# Patient Record
Sex: Male | Born: 1950 | Race: Black or African American | Hispanic: No | Marital: Married | Smoking: Never smoker
Health system: Southern US, Community
[De-identification: ages and names within clinical notes are randomized; demographics above are authoritative.]

## PROBLEM LIST (undated history)

## (undated) DIAGNOSIS — N200 Calculus of kidney: Secondary | ICD-10-CM

---

## 2016-08-17 ENCOUNTER — Encounter (HOSPITAL_COMMUNITY): Payer: Self-pay | Admitting: *Deleted

## 2016-08-17 ENCOUNTER — Emergency Department (HOSPITAL_COMMUNITY)
Admission: EM | Admit: 2016-08-17 | Discharge: 2016-08-17 | Disposition: A | Discharge status: Home / Self Care | Payer: Self-pay | Attending: Emergency Medicine | Admitting: Emergency Medicine

## 2016-08-17 ENCOUNTER — Emergency Department (HOSPITAL_COMMUNITY): Payer: Self-pay

## 2016-08-17 DIAGNOSIS — N1330 Unspecified hydronephrosis: Secondary | ICD-10-CM | POA: Insufficient documentation

## 2016-08-17 DIAGNOSIS — N201 Calculus of ureter: Secondary | ICD-10-CM | POA: Insufficient documentation

## 2016-08-17 DIAGNOSIS — N23 Unspecified renal colic: Secondary | ICD-10-CM

## 2016-08-17 LAB — URINALYSIS, ROUTINE W REFLEX MICROSCOPIC
Bacteria, UA: NONE SEEN
Bilirubin Urine: NEGATIVE
GLUCOSE, UA: NEGATIVE mg/dL
Ketones, ur: NEGATIVE mg/dL
Leukocytes, UA: NEGATIVE
NITRITE: NEGATIVE
Protein, ur: NEGATIVE mg/dL
SPECIFIC GRAVITY, URINE: 1.009 (ref 1.005–1.030)
Squamous Epithelial / LPF: NONE SEEN
pH: 5 (ref 5.0–8.0)

## 2016-08-17 LAB — BASIC METABOLIC PANEL
Anion gap: 8 (ref 5–15)
BUN: 19 mg/dL (ref 6–20)
CO2: 25 mmol/L (ref 22–32)
CREATININE: 1.22 mg/dL (ref 0.61–1.24)
Calcium: 9.3 mg/dL (ref 8.9–10.3)
Chloride: 104 mmol/L (ref 101–111)
Glucose, Bld: 114 mg/dL — ABNORMAL HIGH (ref 65–99)
POTASSIUM: 4.3 mmol/L (ref 3.5–5.1)
SODIUM: 137 mmol/L (ref 135–145)

## 2016-08-17 LAB — CBC WITH DIFFERENTIAL/PLATELET
BASOS PCT: 0 %
Basophils Absolute: 0 10*3/uL (ref 0.0–0.1)
EOS ABS: 0.1 10*3/uL (ref 0.0–0.7)
EOS PCT: 3 %
HCT: 42.4 % (ref 39.0–52.0)
Hemoglobin: 14.3 g/dL (ref 13.0–17.0)
LYMPHS ABS: 1.6 10*3/uL (ref 0.7–4.0)
Lymphocytes Relative: 40 %
MCH: 29.9 pg (ref 26.0–34.0)
MCHC: 33.7 g/dL (ref 30.0–36.0)
MCV: 88.7 fL (ref 78.0–100.0)
Monocytes Absolute: 0.3 10*3/uL (ref 0.1–1.0)
Monocytes Relative: 7 %
Neutro Abs: 2 10*3/uL (ref 1.7–7.7)
Neutrophils Relative %: 50 %
PLATELETS: 184 10*3/uL (ref 150–400)
RBC: 4.78 MIL/uL (ref 4.22–5.81)
RDW: 13.7 % (ref 11.5–15.5)
WBC: 4.1 10*3/uL (ref 4.0–10.5)

## 2016-08-17 MED ORDER — TAMSULOSIN HCL 0.4 MG PO CAPS: 0.4000 mg | ORAL_CAPSULE | Freq: Every day | ORAL | 0 refills | Status: DC

## 2016-08-17 MED ORDER — IBUPROFEN 600 MG PO TABS: 600.0000 mg | ORAL_TABLET | Freq: Four times a day (QID) | ORAL | 0 refills | Status: DC | PRN

## 2016-08-17 MED ORDER — ONDANSETRON HCL 4 MG/2ML IJ SOLN
4.0000 mg | Freq: Once | INTRAMUSCULAR | Status: DC
  Filled 2016-08-17: qty 2

## 2016-08-17 MED ORDER — OXYCODONE-ACETAMINOPHEN 5-325 MG PO TABS: 1.0000 | ORAL_TABLET | Freq: Four times a day (QID) | ORAL | 0 refills | Status: DC | PRN

## 2016-08-17 MED ORDER — FENTANYL CITRATE (PF) 100 MCG/2ML IJ SOLN
50.0000 ug | INTRAMUSCULAR | Status: DC | PRN
Start: 2016-08-17 — End: 2016-08-17
  Filled 2016-08-17: qty 2

## 2016-08-17 MED ORDER — IBUPROFEN 400 MG PO TABS
600.0000 mg | ORAL_TABLET | Freq: Once | ORAL | Status: AC
  Administered 2016-08-17: 06:00:00 600 mg via ORAL
  Filled 2016-08-17: qty 1

## 2016-08-17 NOTE — ED Notes (Signed)
Patient transported to CT 

## 2016-08-17 NOTE — ED Notes (Signed)
Declined W/C at D/C and was escorted to lobby by RN. 

## 2016-08-17 NOTE — ED Provider Notes (Signed)
[facility]-EMERGENCY DEPT Provider Note   CSN: [id number] Arrival date & time: 08/17/16  0341     History   Chief Complaint Chief Complaint  Patient presents with  . Flank Pain    HPI Lance Wells is a 66 y.o. male.  Patient with no past surgical history presents with complaint of severe groin pain starting yesterday (4/24). Patient states that he stood up and pain moved to his right flank. Pain was severe at times. It was so bad he decided to come to the hospital. Symptoms improved and nearly resolved upon arrival. No associated fevers, nausea, vomiting, diarrhea. No urinary symptoms, hematuria, dysuria. No blood in the stool. No lightheadedness or syncope. No chest pain or shortness of breath. No treatments prior to arrival. Patient was ordered pain medication but declined because he was feeling better. No history of kidney stones or known aortic problems. The onset of this condition was acute. The course is resolved. Aggravating factors: none. Alleviating factors: none.        History reviewed. No pertinent past medical history.  There are no active problems to display for this patient.   History reviewed. No pertinent surgical history.     Home Medications    Prior to Admission medications   Not on File    Family History No family history on file.  Social History Social History  Substance Use Topics  . Smoking status: Never Smoker  . Smokeless tobacco: Current User  . Alcohol use No     Allergies   Patient has no known allergies.   Review of Systems Review of Systems  Constitutional: Negative for fever.  HENT: Negative for rhinorrhea and sore throat.   Eyes: Negative for redness.  Respiratory: Negative for cough.   Cardiovascular: Negative for chest pain.  Gastrointestinal: Negative for abdominal pain, diarrhea, nausea and vomiting.  Genitourinary: Positive for flank pain. Negative for dysuria.  Musculoskeletal: Negative for myalgias.  Skin:  Negative for rash.  Neurological: Negative for headaches.     Physical Exam Updated Vital Signs BP 133/85   Pulse 64   Temp 97.7 F (36.5 C)   Resp 18   SpO2 99%   Physical Exam  Constitutional: He appears well-developed and well-nourished.  HENT:  Head: Normocephalic and atraumatic.  Mouth/Throat: Oropharynx is clear and moist.  Eyes: Conjunctivae are normal. Right eye exhibits no discharge. Left eye exhibits no discharge.  Neck: Normal range of motion. Neck supple.  Cardiovascular: Normal rate, regular rhythm and normal heart sounds.   Pulmonary/Chest: Effort normal and breath sounds normal. No respiratory distress. He has no wheezes. He has no rales.  Abdominal: Soft. He exhibits no mass. There is no tenderness. There is no rebound and no guarding.  Neurological: He is alert.  Skin: Skin is warm and dry.  Psychiatric: He has a normal mood and affect.  Nursing note and vitals reviewed.    ED Treatments / Results  Labs (all labs ordered are listed, but only abnormal results are displayed) Labs Reviewed  BASIC METABOLIC PANEL - Abnormal; Notable for the following:       Result Value   Glucose, Bld 114 (*)    All other components within normal limits  URINALYSIS, ROUTINE W REFLEX MICROSCOPIC - Abnormal; Notable for the following:    Color, Urine STRAW (*)    Hgb urine dipstick MODERATE (*)    All other components within normal limits  CBC WITH DIFFERENTIAL/PLATELET    EKG  EKG Interpretation None  Radiology Ct Renal Stone Study  Result Date: 08/17/2016 CLINICAL DATA:  66 year old male with right flank pain radiating to the groin. EXAM: CT ABDOMEN AND PELVIS WITHOUT CONTRAST TECHNIQUE: Multidetector CT imaging of the abdomen and pelvis was performed following the standard protocol without IV contrast. COMPARISON:  None. FINDINGS: Lower chest: There is a 3 mm left lung base subpleural nodule. The visualized lung bases are otherwise clear. No intra-abdominal  free air or free fluid. Hepatobiliary: No focal liver abnormality is seen. No gallstones, gallbladder wall thickening, or biliary dilatation. Pancreas: Unremarkable. No pancreatic ductal dilatation or surrounding inflammatory changes. Spleen: Normal in size without focal abnormality. Adrenals/Urinary Tract: There is a 4 mm partially obstructing right ureteropelvic junction stone with mild right hydronephrosis. The left kidney, adrenal glands, and urinary bladder appear unremarkable. Stomach/Bowel: Moderate stool throughout the colon. No evidence of bowel obstruction or active inflammation. Normal appendix. Vascular/Lymphatic: The abdominal aorta and IVC are grossly unremarkable on this noncontrast study. No portal venous gas identified. There is no adenopathy. Reproductive: The prostate and seminal vesicles are grossly unremarkable. Other: None Musculoskeletal: No acute or significant osseous findings. IMPRESSION: A 4 mm right UPJ stone with mild right hydronephrosis. No bowel obstruction or active inflammation.  Normal appendix. Electronically Signed   By: Elgie Collard M.D.   On: 08/17/2016 05:00    Procedures Procedures (including critical care time)  Medications Ordered in ED Medications  ibuprofen (ADVIL,MOTRIN) tablet 600 mg (not administered)     Initial Impression / Assessment and Plan / ED Course  I have reviewed the triage vital signs and the nursing notes.  Pertinent labs & imaging results that were available during my care of the patient were reviewed by me and considered in my medical decision making (see chart for details).     Patient seen and examined. Work-up initiated.   Vital signs reviewed and are as follows: BP 133/85   Pulse 64   Temp 97.7 F (36.5 C)   Resp 18   SpO2 99%   Patient updated on results. Pain continues to be well-controlled. Patient counseled on kidney stone treatment. Urged patient to strain urine and save any stones. Urged urology follow-up and  return to [facility] with any complications. Counseled patient to maintain good fluid intake.   Counseled patient on use of Flomax.   Patient counseled on use of narcotic pain medications. Counseled not to combine these medications with others containing tylenol. Urged not to drink alcohol, drive, or perform any other activities that requires focus while taking these medications. The patient verbalizes understanding and agrees with the plan.   Final Clinical Impressions(s) / ED Diagnoses   Final diagnoses:  Ureteral colic   Patient with flank pain, symptoms resolved upon arrival. Pain has remained well controlled despite a 4 mm UPJ stone on the right side. This is consistent with the patient's description of pain prior to arrival. No other concerning findings. No urine infection. Normal creatinine. Patient will be discharged home with symptomatic treatment and urology follow-up. Return instructions as above.  New Prescriptions New Prescriptions   IBUPROFEN (ADVIL,MOTRIN) 600 MG TABLET    Take 1 tablet (600 mg total) by mouth every 6 (six) hours as needed.   OXYCODONE-ACETAMINOPHEN (PERCOCET/ROXICET) 5-325 MG TABLET    Take 1-2 tablets by mouth every 6 (six) hours as needed for severe pain.   TAMSULOSIN (FLOMAX) 0.4 MG CAPS CAPSULE    Take 1 capsule (0.4 mg total) by mouth daily.     Renne Crigler, PA-C  08/17/16 1308    Geoffery Lyons, MD 08/18/16 [phone]

## 2016-08-17 NOTE — ED Notes (Signed)
Pt woke this morning with testicular pain; pt now c/o R flank pain. Pt appears uncomfortable.

## 2016-08-17 NOTE — Discharge Instructions (Signed)
Please read and follow all provided instructions.  Your diagnoses today include:  1. Ureteral colic    Tests performed today include:  Urine test that showed blood in your urine and no infection  CT scan which showed a 4 millimeter kidney on the right side  Blood test that showed normal kidney function  Vital signs. See below for your results today.   Medications prescribed:   Percocet (oxycodone/acetaminophen) - narcotic pain medication  DO NOT drive or perform any activities that require you to be awake and alert because this medicine can make you drowsy. BE VERY CAREFUL not to take multiple medicines containing Tylenol (also called acetaminophen). Doing so can lead to an overdose which can damage your liver and cause liver failure and possibly death.   Ibuprofen (Motrin, Advil) - anti-inflammatory pain medication  Do not exceed  ibuprofen every 6 hours, take with food  You have been prescribed an anti-inflammatory medication or NSAID. Take with food. Take smallest effective dose for the shortest duration needed for your pain. Stop taking if you experience stomach pain or vomiting.    Flomax (tamsulosin) - relaxes smooth muscle to help kidney stones pass  Take any prescribed medications only as directed.  Home care instructions:  Follow any educational materials contained in this packet.  Please double your fluid intake for the next several days. Strain your urine and save any stones that may pass.   BE VERY CAREFUL not to take multiple medicines containing Tylenol (also called acetaminophen). Doing so can lead to an overdose which can damage your liver and cause liver failure and possibly death.   Follow-up instructions: Please follow-up with your urologist or the urologist referral (provided on front page) in the next 1 week for further evaluation of your symptoms.  If you need to return to the Emergency Department, go to [facility] and not [facility]. The urologists are located at [facility] and can better care for you at this location.  Return instructions:  If you need to return to the Emergency Department, go to [facility] and not [facility]. The urologists are located at [facility] and can better care for you at this location.   Please return to the Emergency Department if you experience worsening symptoms.  Please return if you develop fever or uncontrolled pain or vomiting.  Please return if you have any other emergent concerns.  Additional Information:  Your vital signs today were: BP 133/85    Pulse 64    Temp 97.7 F (36.5 C)    Resp 18    SpO2 99%  If your blood pressure (BP) was elevated above 135/85 this visit, please have this repeated by your doctor within one month. --------------

## 2016-08-17 NOTE — ED Notes (Signed)
Pt denies pain as present. Declined wanting pain medications

## 2016-08-28 DIAGNOSIS — N200 Calculus of kidney: Secondary | ICD-10-CM | POA: Insufficient documentation

## 2016-08-28 DIAGNOSIS — Z79899 Other long term (current) drug therapy: Secondary | ICD-10-CM | POA: Insufficient documentation

## 2016-08-28 NOTE — ED Notes (Signed)
Took oxycodone before EMS arrival.

## 2016-08-28 NOTE — ED Triage Notes (Signed)
Pt comes from air port after flying home from [place]. 10 days ago he was diagnosed with a 4 mm kidney stone on the right side and passed it 8 days ago and pain ceased.  Tonight he began to have the same right sided pain and pressure.  States it feels the same as the kidney stone.  Vitals WNL. Ambulatory.

## 2016-08-29 ENCOUNTER — Emergency Department (HOSPITAL_COMMUNITY)
Admission: EM | Admit: 2016-08-29 | Discharge: 2016-08-29 | Disposition: A | Discharge status: Home / Self Care | Payer: Self-pay | Attending: Emergency Medicine | Admitting: Emergency Medicine

## 2016-08-29 ENCOUNTER — Encounter (HOSPITAL_COMMUNITY): Payer: Self-pay | Admitting: Emergency Medicine

## 2016-08-29 DIAGNOSIS — N200 Calculus of kidney: Secondary | ICD-10-CM

## 2016-08-29 HISTORY — DX: Calculus of kidney: N20.0

## 2016-08-29 LAB — CBC WITH DIFFERENTIAL/PLATELET
BASOS PCT: 0 %
Basophils Absolute: 0 10*3/uL (ref 0.0–0.1)
Eosinophils Absolute: 0.2 10*3/uL (ref 0.0–0.7)
Eosinophils Relative: 3 %
HEMATOCRIT: 44 % (ref 39.0–52.0)
HEMOGLOBIN: 15.7 g/dL (ref 13.0–17.0)
LYMPHS ABS: 1.9 10*3/uL (ref 0.7–4.0)
Lymphocytes Relative: 32 %
MCH: 31.2 pg (ref 26.0–34.0)
MCHC: 35.7 g/dL (ref 30.0–36.0)
MCV: 87.3 fL (ref 78.0–100.0)
MONOS PCT: 10 %
Monocytes Absolute: 0.6 10*3/uL (ref 0.1–1.0)
NEUTROS PCT: 55 %
Neutro Abs: 3.3 10*3/uL (ref 1.7–7.7)
Platelets: 198 10*3/uL (ref 150–400)
RBC: 5.04 MIL/uL (ref 4.22–5.81)
RDW: 13.8 % (ref 11.5–15.5)
WBC: 5.9 10*3/uL (ref 4.0–10.5)

## 2016-08-29 LAB — URINALYSIS, ROUTINE W REFLEX MICROSCOPIC
Bacteria, UA: NONE SEEN
Bilirubin Urine: NEGATIVE
GLUCOSE, UA: NEGATIVE mg/dL
KETONES UR: NEGATIVE mg/dL
Leukocytes, UA: NEGATIVE
Nitrite: NEGATIVE
PH: 5 (ref 5.0–8.0)
Protein, ur: NEGATIVE mg/dL
SPECIFIC GRAVITY, URINE: 1.03 (ref 1.005–1.030)

## 2016-08-29 LAB — I-STAT CHEM 8, ED
BUN: 23 mg/dL — AB (ref 6–20)
CALCIUM ION: 1.11 mmol/L — AB (ref 1.15–1.40)
CHLORIDE: 106 mmol/L (ref 101–111)
CREATININE: 1 mg/dL (ref 0.61–1.24)
GLUCOSE: 123 mg/dL — AB (ref 65–99)
HCT: 44 % (ref 39.0–52.0)
Hemoglobin: 15 g/dL (ref 13.0–17.0)
Potassium: 4.5 mmol/L (ref 3.5–5.1)
Sodium: 138 mmol/L (ref 135–145)
TCO2: 25 mmol/L (ref 0–100)

## 2016-08-29 MED ORDER — KETOROLAC TROMETHAMINE 30 MG/ML IJ SOLN
30.0000 mg | Freq: Once | INTRAMUSCULAR | Status: AC
  Administered 2016-08-29: 30 mg via INTRAVENOUS
  Filled 2016-08-29: qty 1

## 2016-08-29 MED ORDER — HYDROMORPHONE HCL 1 MG/ML IJ SOLN
1.0000 mg | Freq: Once | INTRAMUSCULAR | Status: AC
  Administered 2016-08-29: 1 mg via INTRAVENOUS
  Filled 2016-08-29: qty 1

## 2016-08-29 MED ORDER — ONDANSETRON HCL 4 MG/2ML IJ SOLN
4.0000 mg | Freq: Once | INTRAMUSCULAR | Status: AC
  Administered 2016-08-29: 4 mg via INTRAVENOUS
  Filled 2016-08-29: qty 2

## 2016-08-29 NOTE — ED Notes (Signed)
Pt stated the pain in his testicles started around 2130 tonight.

## 2016-08-29 NOTE — ED Provider Notes (Signed)
[facility]-EMERGENCY DEPT Provider Note   CSN: [id number] Arrival date & time: 08/28/16  2343  By signing my name below, I, Lance Wells, attest that this documentation has been prepared under the direction and in the presence of non-physician practitioner, Roxy Horseman, PA-C. Electronically Signed: Majel Wells, Scribe. 08/29/2016. 1:01 AM.  History   Chief Complaint Chief Complaint  Patient presents with  . Flank Pain   The history is provided by the patient. No language interpreter was used.   HPI Comments: Lance Wells is a 66 y.o. male with PMHx of kidney stones, who presents to the Emergency Department complaining of gradually worsening, right flank pain that began ~2 weeks ago and worsened this morning. Pt reports he visited [facility] ED on 08/17/16 for similar flank pain in which he received a CT renal stone study that revealed a 4 mm right UPJ stone. He believes he passed this stone 8 days ago and states his pain completely resolved before returning again this morning while on an airplane. He notes associated nausea and bilateral testicle pain described as "aching" that has now resolved. Pt reports he has been urinating through his strainer at home but has not visualized any stones. He states he has taken Flomax, Ibuprofen, and Oxycodone with no relief of his pain. He denies any dysuria or hematuria.   Past Medical History:  Diagnosis Date  . Kidney stone    There are no active problems to display for this patient.  History reviewed. No pertinent surgical history.  Home Medications    Prior to Admission medications   Medication Sig Start Date End Date Taking? Authorizing Provider  oxyCODONE-acetaminophen (PERCOCET/ROXICET) 5-325 MG tablet Take 1-2 tablets by mouth every 6 (six) hours as needed for severe pain. 08/17/16  Yes Renne Crigler, PA-C  ibuprofen (ADVIL,MOTRIN) 600 MG tablet Take 1 tablet (600 mg total) by mouth every 6 (six) hours as needed. Patient not taking: Reported on  08/29/2016 08/17/16   Renne Crigler, PA-C  tamsulosin (FLOMAX) 0.4 MG CAPS capsule Take 1 capsule (0.4 mg total) by mouth daily. Patient not taking: Reported on 08/29/2016 08/17/16   Renne Crigler, PA-C    Family History No family history on file.  Social History Social History  Substance Use Topics  . Smoking status: Never Smoker  . Smokeless tobacco: Current User  . Alcohol use No   Allergies   Patient has no known allergies.  Review of Systems Review of Systems  Gastrointestinal: Positive for nausea.  Genitourinary: Positive for flank pain and testicular pain (resolved). Negative for dysuria and hematuria.  All other systems reviewed and are negative.  Physical Exam Updated Vital Signs Pulse (!) 56   Temp 97.8 F (36.6 C) (Oral)   Resp 18   Ht 5\' 10"  (1.778 m)   Wt 210 lb (95.3 kg)   SpO2 99%   BMI 30.13 kg/m   Physical Exam  Constitutional: He is oriented to person, place, and time. He appears well-developed and well-nourished.  HENT:  Head: Normocephalic and atraumatic.  Eyes: Conjunctivae and EOM are normal. Pupils are equal, round, and reactive to light. Right eye exhibits no discharge. Left eye exhibits no discharge. No scleral icterus.  Neck: Normal range of motion. Neck supple. No JVD present.  Cardiovascular: Normal rate, regular rhythm and normal heart sounds.  Exam reveals no gallop and no friction rub.   No murmur heard. Pulmonary/Chest: Effort normal and breath sounds normal. No respiratory distress. He has no wheezes. He has no rales. He  exhibits no tenderness.  Abdominal: Soft. He exhibits no distension and no mass. There is no tenderness. There is no rebound and no guarding.  Musculoskeletal: Normal range of motion. He exhibits no edema or tenderness.  Neurological: He is alert and oriented to person, place, and time.  Skin: Skin is warm and dry.  Psychiatric: He has a normal mood and affect. His behavior is normal. Judgment and thought content normal.    Nursing note and vitals reviewed.  ED Treatments / Results  DIAGNOSTIC STUDIES:  Oxygen Saturation is 99% on RA, normal by my interpretation.    COORDINATION OF CARE:  12:53 AM Discussed treatment plan with pt at bedside and pt agreed to plan.  Labs (all labs ordered are listed, but only abnormal results are displayed) Labs Reviewed  URINALYSIS, ROUTINE W REFLEX MICROSCOPIC    EKG  EKG Interpretation None       Radiology No results found.  Procedures Procedures (including critical care time)  Medications Ordered in ED Medications - No data to display  Initial Impression / Assessment and Plan / ED Course  I have reviewed the triage vital signs and the nursing notes.  Pertinent labs & imaging results that were available during my care of the patient were reviewed by me and considered in my medical decision making (see chart for details).     Patient with recent diagnosis of kidney stone. He states that his pain returned today. He has been unable to follow-up with his urologist.  Urinalysis shows hemoglobin. I believe the patient still has not passed his kidney stone. His vital signs are stable. His pain is now well controlled. He has no evidence of AKI or urinary tract infection. Will discharge to home with urology follow-up. Patient understands and agrees with the plan. He is stable and ready for discharge.  I personally performed the services described in this documentation, which was scribed in my presence. The recorded information has been reviewed and is accurate.     Final Clinical Impressions(s) / ED Diagnoses   Final diagnoses:  Kidney stone    New Prescriptions New Prescriptions   No medications on file     Roxy HorsemanBrowning, Sarie Stall, Cordelia Poche-C 08/29/16 [id number]    Devoria AlbeKnapp, Iva, MD 08/29/16 [phone]

## 2016-08-29 NOTE — ED Notes (Signed)
Pt continues to attempt to take own pain medicine. This RN instructed patient not to take any additional pain medication until a physician is able to see him

## 2016-08-29 NOTE — ED Notes (Signed)
Bed: [id number] Expected date:  Expected time:  Means of arrival:  Comments: Mayford KnifeWilliams

## 2016-09-05 ENCOUNTER — Ambulatory Visit (INDEPENDENT_AMBULATORY_CARE_PROVIDER_SITE_OTHER): Payer: Self-pay | Admitting: Podiatry

## 2016-09-05 ENCOUNTER — Encounter: Payer: Self-pay | Admitting: Podiatry

## 2016-09-05 DIAGNOSIS — L603 Nail dystrophy: Secondary | ICD-10-CM

## 2016-09-05 DIAGNOSIS — B351 Tinea unguium: Secondary | ICD-10-CM

## 2016-09-05 DIAGNOSIS — M79676 Pain in unspecified toe(s): Secondary | ICD-10-CM

## 2016-09-05 MED ORDER — DOXYCYCLINE HYCLATE 100 MG PO TABS: 100.0000 mg | ORAL_TABLET | Freq: Two times a day (BID) | ORAL | 0 refills | Status: DC

## 2016-09-05 MED ORDER — OXYCODONE-ACETAMINOPHEN 5-325 MG PO TABS: 1.0000 | ORAL_TABLET | Freq: Four times a day (QID) | ORAL | 0 refills | Status: DC | PRN

## 2016-09-05 NOTE — Patient Instructions (Signed)

## 2016-09-05 NOTE — Progress Notes (Signed)
   HPI: Patient is a 66 year old male presenting as a new patient complaining of elongated, thickened, discolored right great toe nail. He states the nail has worsened over time and wearing shoes increases the pain. He is here for further evaluation and treatment.     Physical Exam: General: The patient is alert and oriented x3 in no acute distress.  Dermatology: Hyperkeratotic, discolored, thickened, onychodystrophy of right great toenail. Skin is warm, dry and supple bilateral lower extremities. Negative for open lesions or macerations.  Vascular: Palpable pedal pulses bilaterally. No edema or erythema noted. Capillary refill within normal limits.  Neurological: Epicritic and protective threshold grossly intact bilaterally.   Musculoskeletal Exam: Range of motion within normal limits to all pedal and ankle joints bilateral. Muscle strength 5/5 in all groups bilateral.    Assessment: 1. Dystrophic nail right great toe   Plan of Care:  1. Patient was evaluated. 2. Discussed treatment alternatives and plan of care. Explained nail avulsion procedure and post procedure course to patient. 3. Patient opted for permanent total nail avulsion.  4. Prior to procedure, local anesthesia infiltration utilized using 3 ml of a 50:50 mixture of 2% plain lidocaine and 0.5% plain marcaine in a normal hallux block fashion and a betadine prep performed.  5. Total permanent nail avulsion with chemical matrixectomy performed using 3x30sec applications of phenol followed by alcohol flush.  6. Light dressing applied. 7. Prescription for Doxycycline 100 mg #20 given to patient. 8. Prescription for Percocet PRN provided. 9. Postop shoe dispensed.  10. Return to clinic in 1 week.   Felecia ShellingBrent M. Evans, DPM [facility]  Dr. Felecia ShellingBrent M. Evans, DPM    [address]                                        [place], [place] [address]                Office [phone]  Fax [phone]

## 2016-09-06 ENCOUNTER — Telehealth: Payer: Self-pay | Admitting: *Deleted

## 2016-09-06 NOTE — Telephone Encounter (Signed)
No soaks. Treating it more like a surgical patient. Keep dressings clean and dry until I see him next week.  Thanks, Dr. Logan BoresEvans

## 2016-09-06 NOTE — Telephone Encounter (Addendum)
Pt states had to get dressing change yesterday after the procedure because it had bled through after the toenail was removed. I asked the pt if he had begun his soaks and he states Dr. Logan BoresEvans said not to begin the soaks until after next week's visit. I told pt I would transfer to the schedulers to get on the nursing schedule for a redress and ask Dr. Logan BoresEvans if he wanted him to begin the dressing changes and soaks after this dressing change.09/07/2016-Unable to leave a message mailbox is full, could not informing pt that he did not need to perform soaks until directed by Dr. Logan BoresEvans. Routed message to Tomasa HoseJ. Quintana, RN to inform pt at dressing change today.

## 2016-09-07 ENCOUNTER — Ambulatory Visit (INDEPENDENT_AMBULATORY_CARE_PROVIDER_SITE_OTHER): Payer: Self-pay | Admitting: Podiatry

## 2016-09-07 DIAGNOSIS — L603 Nail dystrophy: Secondary | ICD-10-CM

## 2016-09-07 DIAGNOSIS — M79676 Pain in unspecified toe(s): Secondary | ICD-10-CM

## 2016-09-07 DIAGNOSIS — B351 Tinea unguium: Secondary | ICD-10-CM

## 2016-09-13 NOTE — Progress Notes (Signed)
Patient presents status post complete nail avulsion surgery on 09/05/16. He has bleed through his bandage and needs it to be changed. He states that the area is tender. He is no longer taking his RX pain medications and is just using ibuprofen for pain   Removed soiled bandage, noted nail bed to be red and moist, all WNL for this procedure. Some drainage noted. No s/s of infection   Flushed area with normal saline, applied silvadene and telfa pad with sterile gauze. Advised on s/s of infection, he verbalized understanding. He is to keep his follow up appt with Dr Logan BoresEvans

## 2016-09-14 ENCOUNTER — Encounter: Payer: Self-pay | Admitting: Podiatry

## 2016-09-14 ENCOUNTER — Ambulatory Visit (INDEPENDENT_AMBULATORY_CARE_PROVIDER_SITE_OTHER): Payer: Self-pay | Admitting: Podiatry

## 2016-09-14 DIAGNOSIS — S91209D Unspecified open wound of unspecified toe(s) with damage to nail, subsequent encounter: Secondary | ICD-10-CM

## 2016-09-14 DIAGNOSIS — S91109D Unspecified open wound of unspecified toe(s) without damage to nail, subsequent encounter: Secondary | ICD-10-CM

## 2016-09-14 DIAGNOSIS — M79676 Pain in unspecified toe(s): Secondary | ICD-10-CM

## 2016-09-15 NOTE — Progress Notes (Signed)
   HPI:  Patient presents today for follow-up evaluation of a total temporary nail avulsion to the right great toe. Patient originally presented with a large thickened hyperkeratotic nail significant in size. Patient was last seen 09/05/2016. At that time the nail was removed in toto and dry sterile dressing was applied. Patient presents today for follow-up treatment and evaluation with application of phenol for total permanent nail matricectomy of the right great. Patient does state that there was a significant amount of bleeding with moderate pain.    Physical Exam: General: The patient is alert and oriented x3 in no acute distress.  Dermatology: Nail bed to the right great toe appears granular and healthy. No purulent drainage. No malodor. No sign of infectious process noted.   Neurological: Epicritic and protective threshold grossly intact bilaterally.   Musculoskeletal Exam: Range of motion within normal limits to all pedal and ankle joints bilateral. Muscle strength 5/5 in all groups bilateral.   Assessment: 1.  status post total temporary nail avulsion right great toe    Plan of Care:  1. Patient was evaluated. 2.  debridement of the nail bed was performed.   3. Application of 330 seconds phenol was applied to the nailbed and nail root for total permanent nail matricectomy. Prior to procedure local anesthesia infiltration was utilized hallux block fashion consisting of 50-50 mixture of 2% lidocaine plain with 0.5% Marcaine plain.   4. Dry sterile dressings were applied. 5. Return to clinic in 2 weeks   Felecia ShellingBrent M. Evans, DPM [facility]  Dr. Felecia ShellingBrent M. Evans, DPM    [address]                                        [place], [place] [address]                Office [phone]  Fax [phone]

## 2016-09-28 ENCOUNTER — Ambulatory Visit: Payer: Self-pay | Admitting: Podiatry

## 2017-02-11 ENCOUNTER — Emergency Department (HOSPITAL_COMMUNITY)
Admission: EM | Admit: 2017-02-11 | Discharge: 2017-02-11 | Disposition: A | Discharge status: Home / Self Care | Payer: Self-pay | Attending: Emergency Medicine | Admitting: Emergency Medicine

## 2017-02-11 ENCOUNTER — Emergency Department (HOSPITAL_COMMUNITY): Payer: Self-pay

## 2017-02-11 DIAGNOSIS — N201 Calculus of ureter: Secondary | ICD-10-CM | POA: Insufficient documentation

## 2017-02-11 DIAGNOSIS — Z79899 Other long term (current) drug therapy: Secondary | ICD-10-CM | POA: Insufficient documentation

## 2017-02-11 LAB — URINALYSIS, ROUTINE W REFLEX MICROSCOPIC
Bilirubin Urine: NEGATIVE
GLUCOSE, UA: NEGATIVE mg/dL
HGB URINE DIPSTICK: NEGATIVE
Ketones, ur: NEGATIVE mg/dL
LEUKOCYTES UA: NEGATIVE
Nitrite: NEGATIVE
Protein, ur: NEGATIVE mg/dL
SPECIFIC GRAVITY, URINE: 1.017 (ref 1.005–1.030)
pH: 8 (ref 5.0–8.0)

## 2017-02-11 LAB — CBC
HCT: 40.7 % (ref 39.0–52.0)
HEMOGLOBIN: 14.5 g/dL (ref 13.0–17.0)
MCH: 31.3 pg (ref 26.0–34.0)
MCHC: 35.6 g/dL (ref 30.0–36.0)
MCV: 87.7 fL (ref 78.0–100.0)
Platelets: 207 10*3/uL (ref 150–400)
RBC: 4.64 MIL/uL (ref 4.22–5.81)
RDW: 14 % (ref 11.5–15.5)
WBC: 8.6 10*3/uL (ref 4.0–10.5)

## 2017-02-11 LAB — COMPREHENSIVE METABOLIC PANEL
ALBUMIN: 4 g/dL (ref 3.5–5.0)
ALK PHOS: 55 U/L (ref 38–126)
ALT: 34 U/L (ref 17–63)
ANION GAP: 10 (ref 5–15)
AST: 32 U/L (ref 15–41)
BILIRUBIN TOTAL: 0.7 mg/dL (ref 0.3–1.2)
BUN: 16 mg/dL (ref 6–20)
CO2: 25 mmol/L (ref 22–32)
Calcium: 9.2 mg/dL (ref 8.9–10.3)
Chloride: 101 mmol/L (ref 101–111)
Creatinine, Ser: 1.24 mg/dL (ref 0.61–1.24)
GFR calc non Af Amer: 60 mL/min — ABNORMAL LOW (ref >60)
Glucose, Bld: 121 mg/dL — ABNORMAL HIGH (ref 65–99)
POTASSIUM: 4 mmol/L (ref 3.5–5.1)
SODIUM: 136 mmol/L (ref 135–145)
Total Protein: 7.4 g/dL (ref 6.5–8.1)

## 2017-02-11 LAB — LIPASE, BLOOD: LIPASE: 26 U/L (ref 11–51)

## 2017-02-11 MED ORDER — HYDROMORPHONE HCL 1 MG/ML IJ SOLN
1.0000 mg | Freq: Once | INTRAMUSCULAR | Status: AC
  Administered 2017-02-11: 1 mg via INTRAVENOUS
  Filled 2017-02-11: qty 1

## 2017-02-11 MED ORDER — OXYCODONE-ACETAMINOPHEN 5-325 MG PO TABS: 1.0000 | ORAL_TABLET | Freq: Four times a day (QID) | ORAL | 0 refills | Status: AC | PRN

## 2017-02-11 MED ORDER — ONDANSETRON HCL 4 MG/2ML IJ SOLN
4.0000 mg | Freq: Once | INTRAMUSCULAR | Status: AC
  Administered 2017-02-11: 4 mg via INTRAVENOUS
  Filled 2017-02-11: qty 2

## 2017-02-11 MED ORDER — ONDANSETRON 4 MG PO TBDP: 4.0000 mg | ORAL_TABLET | Freq: Three times a day (TID) | ORAL | 0 refills | Status: AC | PRN

## 2017-02-11 MED ORDER — KETOROLAC TROMETHAMINE 30 MG/ML IJ SOLN
15.0000 mg | Freq: Once | INTRAMUSCULAR | Status: AC
  Administered 2017-02-11: 15 mg via INTRAVENOUS
  Filled 2017-02-11: qty 1

## 2017-02-11 MED ORDER — TAMSULOSIN HCL 0.4 MG PO CAPS: 0.4000 mg | ORAL_CAPSULE | Freq: Every day | ORAL | 0 refills | Status: AC

## 2017-02-11 MED ORDER — SODIUM CHLORIDE 0.9 % IV BOLUS (SEPSIS)
500.0000 mL | Freq: Once | INTRAVENOUS | Status: AC
  Administered 2017-02-11: 500 mL via INTRAVENOUS

## 2017-02-11 NOTE — Discharge Instructions (Signed)
Take Ibuprofen three times daily for pain/inflammation Take Flomax once daily for two weeks or until stone is passed Take Percocet as needed for pain Take Zofran as needed for nausea Follow up with urology  Return to the ED for worsening symptoms

## 2017-02-11 NOTE — ED Triage Notes (Signed)
Urine sample collected in triage. ENM

## 2017-02-11 NOTE — ED Provider Notes (Signed)
[facility]-EMERGENCY DEPT Provider Note   CSN: [id number] Arrival date & time: 02/11/17  1331   History   Chief Complaint Chief Complaint  Patient presents with  . Flank Pain  . Abdominal Pain    HPI Lance Wells is a 66 y.o. male who presents with right flank pain. PMH significant for hx of kidney stones. He states that the pain started acutely this morning. He originally felt it over the right lower quadrant but now he feels it in his right flank. Feels similar to prior kidney stone. He reports associated nausea. He has not had to have stone removed in the past. He denies fever, chills, chest pain, SOB, vomiting, diarrhea, constipation, urinary symptoms. No prior abdominal surgeries.   HPI  Past Medical History:  Diagnosis Date  . Kidney stone     There are no active problems to display for this patient.   No past surgical history on file.     Home Medications    Prior to Admission medications   Medication Sig Start Date End Date Taking? Authorizing Provider  doxycycline (VIBRA-TABS) 100 MG tablet Take 1 tablet (100 mg total) by mouth 2 (two) times daily. 09/05/16   Felecia ShellingEvans, Brent M, DPM  ibuprofen (ADVIL,MOTRIN) 600 MG tablet Take 1 tablet (600 mg total) by mouth every 6 (six) hours as needed. Patient not taking: Reported on 08/29/2016 08/17/16   Renne CriglerGeiple, Joshua, PA-C  oxyCODONE-acetaminophen (PERCOCET/ROXICET) 5-325 MG tablet Take 1-2 tablets by mouth every 6 (six) hours as needed for severe pain. 09/05/16   Felecia ShellingEvans, Brent M, DPM  tamsulosin (FLOMAX) 0.4 MG CAPS capsule Take 1 capsule (0.4 mg total) by mouth daily. Patient not taking: Reported on 08/29/2016 08/17/16   Renne CriglerGeiple, Joshua, PA-C    Family History No family history on file.  Social History Social History  Substance Use Topics  . Smoking status: Never Smoker  . Smokeless tobacco: Current User  . Alcohol use No     Allergies   Patient has no known allergies.   Review of  Systems Review of Systems  Constitutional: Negative for chills and fever.  Respiratory: Negative for shortness of breath.   Cardiovascular: Negative for chest pain.  Gastrointestinal: Positive for abdominal pain and nausea. Negative for constipation, diarrhea and vomiting.  Genitourinary: Positive for flank pain. Negative for dysuria, hematuria and testicular pain.  All other systems reviewed and are negative.    Physical Exam Updated Vital Signs BP (!) 143/89 (BP Location: Left Arm)   Pulse 64   Temp 98.5 F (36.9 C) (Oral)   SpO2 99%   Physical Exam  Constitutional: He is oriented to person, place, and time. He appears well-developed and well-nourished. No distress.  Cooperative. Sitting on hands and knees on stretcher  HENT:  Head: Normocephalic and atraumatic.  Eyes: Pupils are equal, round, and reactive to light. Conjunctivae are normal. Right eye exhibits no discharge. Left eye exhibits no discharge. No scleral icterus.  Neck: Normal range of motion.  Cardiovascular: Normal rate and regular rhythm.  Exam reveals no gallop and no friction rub.   No murmur heard. Pulmonary/Chest: Effort normal and breath sounds normal. No respiratory distress. He has no wheezes. He has no rales. He exhibits no tenderness.  Abdominal: Soft. Bowel sounds are normal. He exhibits no distension and no mass. There is tenderness (R CVA tenderness). There is no rebound and no guarding. No hernia.  Neurological: He is alert and oriented to person, place, and time.  Skin: Skin  is warm and dry.  Psychiatric: He has a normal mood and affect. His behavior is normal.  Nursing note and vitals reviewed.    ED Treatments / Results  Labs (all labs ordered are listed, but only abnormal results are displayed) Labs Reviewed  COMPREHENSIVE METABOLIC PANEL - Abnormal; Notable for the following:       Result Value   Glucose, Bld 121 (*)    GFR calc non Af Amer 60 (*)    All other components within normal  limits  LIPASE, BLOOD  CBC  URINALYSIS, ROUTINE W REFLEX MICROSCOPIC    EKG  EKG Interpretation None       Radiology Ct Renal Stone Study  Result Date: 02/11/2017 CLINICAL DATA:  Right-sided abdominal pain radiating into the back. History of kidney stones. EXAM: CT ABDOMEN AND PELVIS WITHOUT CONTRAST TECHNIQUE: Multidetector CT imaging of the abdomen and pelvis was performed following the standard protocol without IV contrast. COMPARISON:  August 17, 2016 FINDINGS: Lower chest: A nodule in the left lung base on series 3, image 48 measures 5 mm, not significantly changed. A nodule in the right lung on series 3, image 10 was not included on the previous study and measures 3.4 mm. Lung bases are otherwise normal. Hepatobiliary: No focal liver abnormality is seen. No gallstones, gallbladder wall thickening, or biliary dilatation. Pancreas: Unremarkable. No pancreatic ductal dilatation or surrounding inflammatory changes. Spleen: Normal in size without focal abnormality. Adrenals/Urinary Tract: Adrenal glands are normal. No renal stones are noted. Moderate right hydronephrosis with severe right perinephric stranding is identified. There is right ureterectasis is well with a 4 mm stone in the distal right ureter just above the UVJ on series 2, image 77. No other renal abnormalities are identified. Stomach/Bowel: The stomach and small bowel are normal. The colon and appendix are normal. Vascular/Lymphatic: No significant vascular findings are present. No enlarged abdominal or pelvic lymph nodes. Reproductive: Prostate is unremarkable. Other: No abdominal wall hernia or abnormality. No abdominopelvic ascites. Musculoskeletal: No acute or significant osseous findings. IMPRESSION: 1. There is a 4 mm stone in the distal right ureter just above the UVJ resulting in moderate hydronephrosis and severe right perinephric stranding. 2. The 5 mm nodule in the left lung base is stable. A 3 mm nodule in the right lung  was not imaged previously. No follow-up needed if patient is low-risk. Non-contrast chest CT can be considered in 12 months if patient is high-risk. This recommendation follows the consensus statement: Guidelines for Management of Incidental Pulmonary Nodules Detected on CT Images: From the Fleischner Society 2017; Radiology 2017; 284:228-243. 3. No other significant abnormalities. Electronically Signed   By: Gerome Sam III M.D   On: 02/11/2017 16:10    Procedures Procedures (including critical care time)  Medications Ordered in ED Medications  ketorolac (TORADOL) 30 MG/ML injection 15 mg (15 mg Intravenous Given 02/11/17 1626)  sodium chloride 0.9 % bolus 500 mL (0 mLs Intravenous Stopped 02/11/17 1717)  HYDROmorphone (DILAUDID) injection 1 mg (1 mg Intravenous Given 02/11/17 1626)  ondansetron (ZOFRAN) injection 4 mg (4 mg Intravenous Given 02/11/17 1626)     Initial Impression / Assessment and Plan / ED Course  I have reviewed the triage vital signs and the nursing notes.  Pertinent labs & imaging results that were available during my care of the patient were reviewed by me and considered in my medical decision making (see chart for details).  66 year old male presents with right sided obstructing stone above the UVJ.  Vitals are normal. Labs are overall unremarkable. UA is clean. CT shows stone with moderate right hydronephrosis with perinephric stranding. Unlikely infectious given no fever, leukocytosis, and clean UA. After pain medicine he is much more comfortable. Will give rx for pain medicine, flomax, zofran. Gave urology f/u and strict return precautions.   Final Clinical Impressions(s) / ED Diagnoses   Final diagnoses:  Calculus of ureter    New Prescriptions New Prescriptions   No medications on file     Bethel Born, PA-C 02/11/17 1735    Maia Plan, MD 02/11/17 [phone]

## 2017-02-11 NOTE — ED Triage Notes (Signed)
Patient c/o lower right sided abdominal pain radiating into the back. Pt reports hx of kidney stones. C/o minor nausea, No V/D. Denies urinary issues.

## 2017-02-11 NOTE — ED Notes (Signed)
Bed: WA05 Expected date:  Expected time:  Means of arrival:  Comments: 

## 2018-03-05 IMAGING — CT CT RENAL STONE PROTOCOL
2 of 3 series · 16 of 46 positions shown, 18 images · non-contrast
Comparison: August 17, 2016

CLINICAL DATA: Right-sided abdominal pain radiating into the back.
History of kidney stones.

EXAM:
CT ABDOMEN AND PELVIS WITHOUT CONTRAST
TECHNIQUE: Multidetector CT imaging of the abdomen and pelvis was performed
following the standard protocol without IV contrast.

[Series 3: lung · axial · 0.82mm/px · z∈[-212,-100]mm · 13 of 66 slices shown, 15 images]
[im 5/66  soft-tissue]
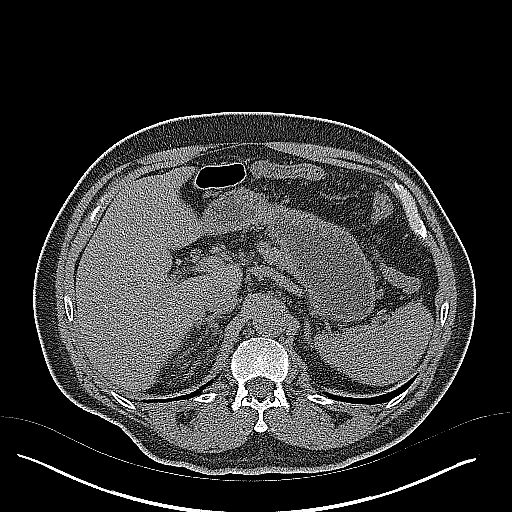
[im 5/66  bone]
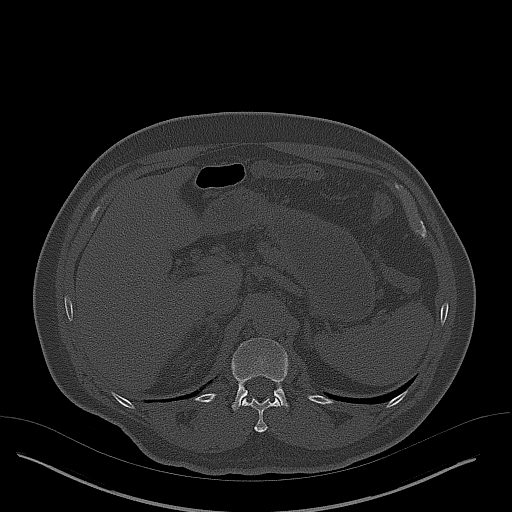
[im 9/66  soft-tissue]
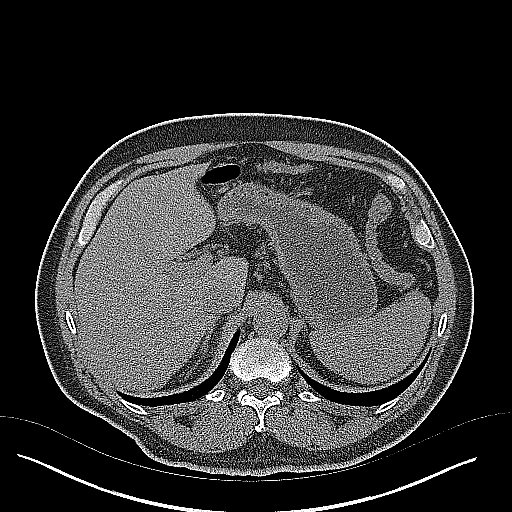
[im 13/66  soft-tissue]
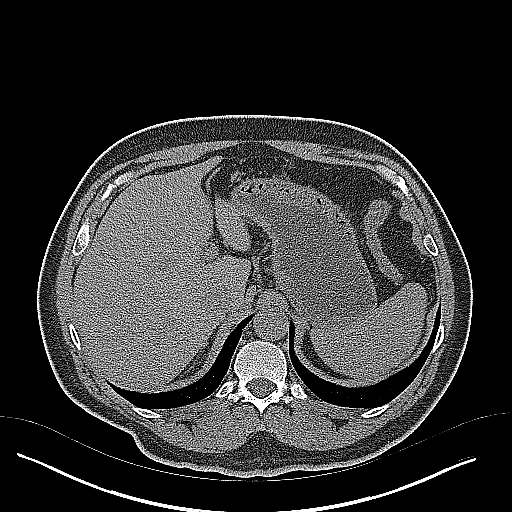
[im 19/66  soft-tissue]
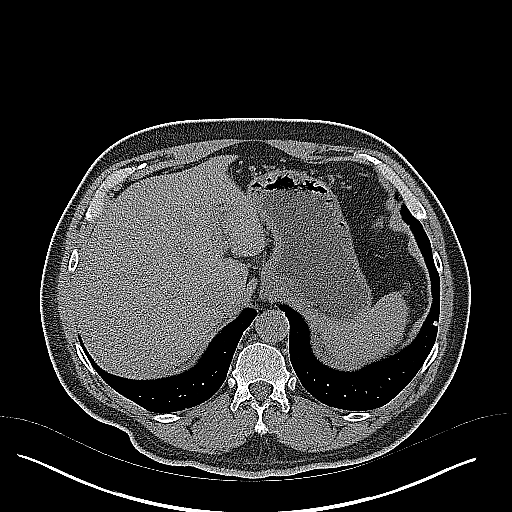
[im 24/66  soft-tissue]
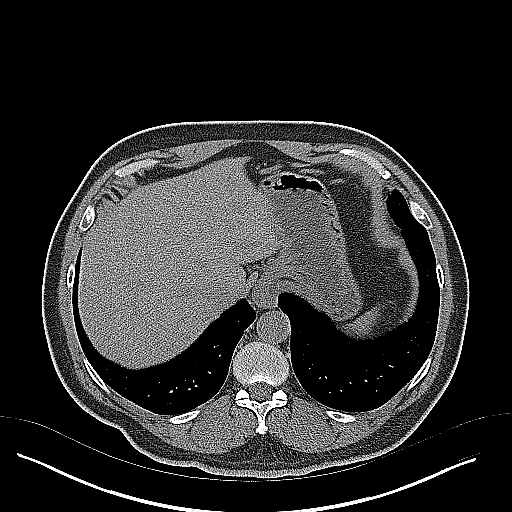
[im 28/66  soft-tissue]
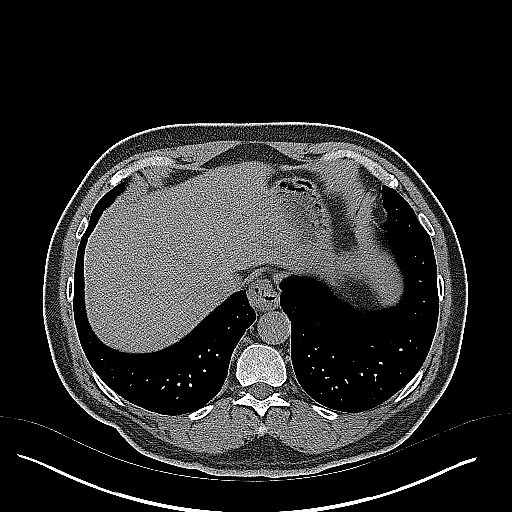
[im 34/66  soft-tissue]
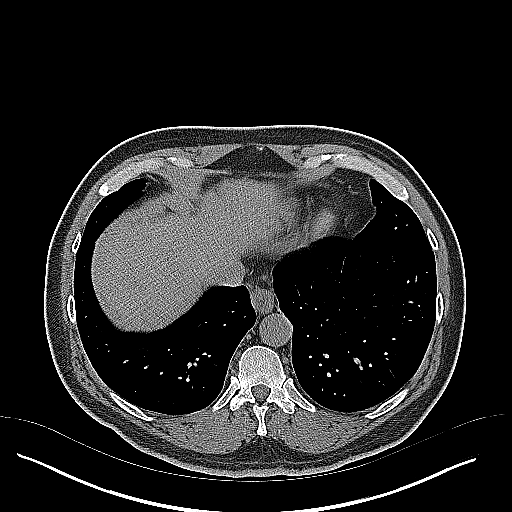
[im 38/66  soft-tissue]
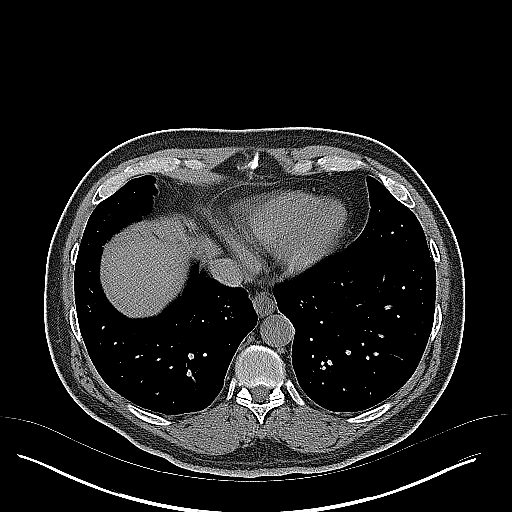
[im 42/66  soft-tissue]
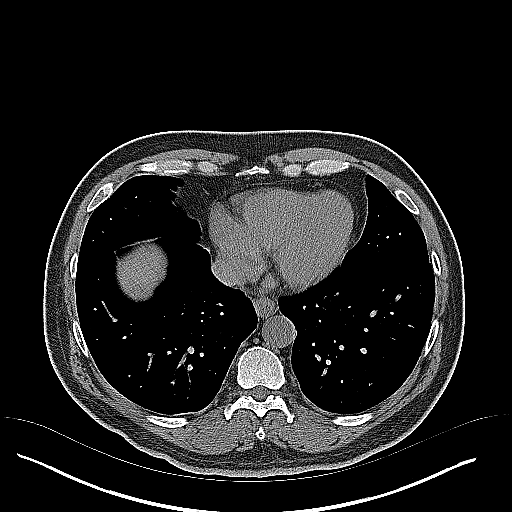
[im 42/66  bone]
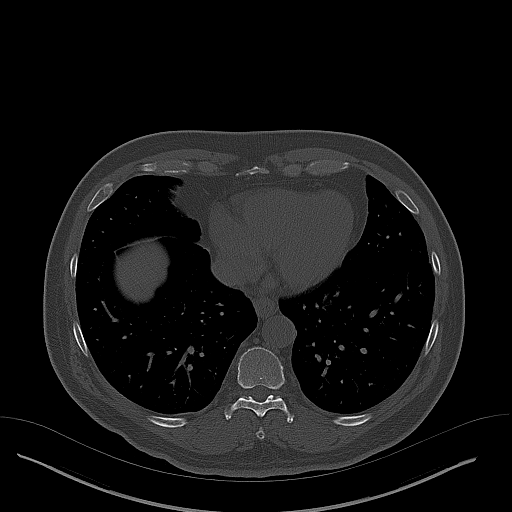
[im 47/66  soft-tissue]
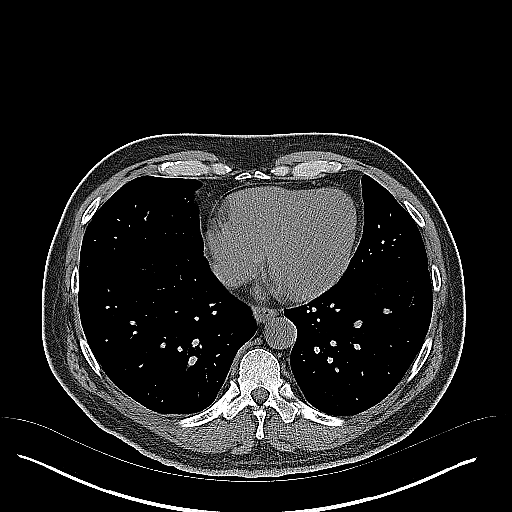
[im 53/66  soft-tissue]
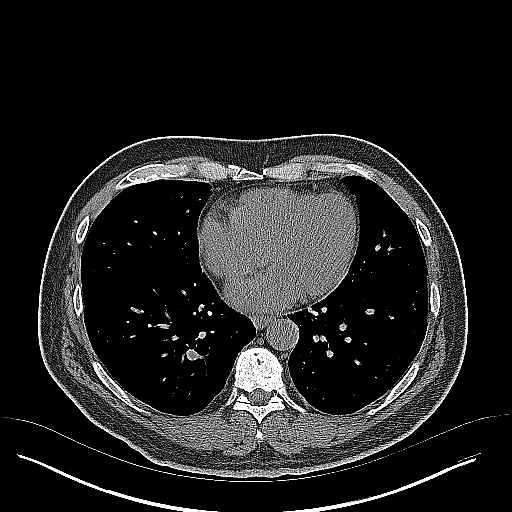
[im 57/66  soft-tissue]
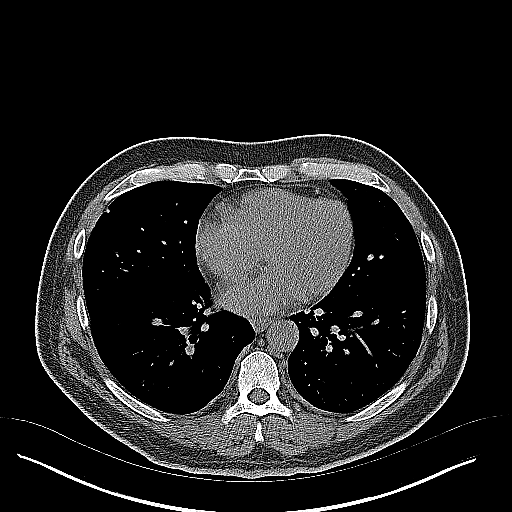
[im 61/66  soft-tissue]
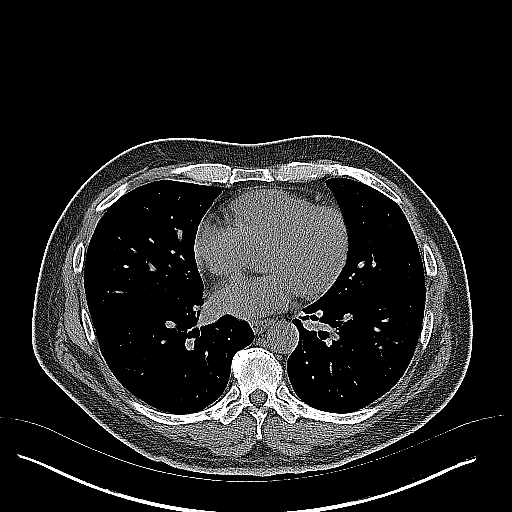

[Series 4: coronal · coronal · 0.74mm/px · 3 of 141 slices shown]
[im 47/141  soft-tissue]
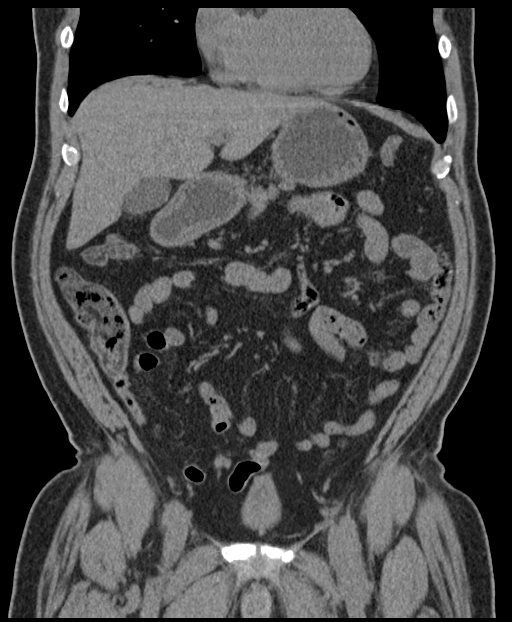
[im 63/141  soft-tissue]
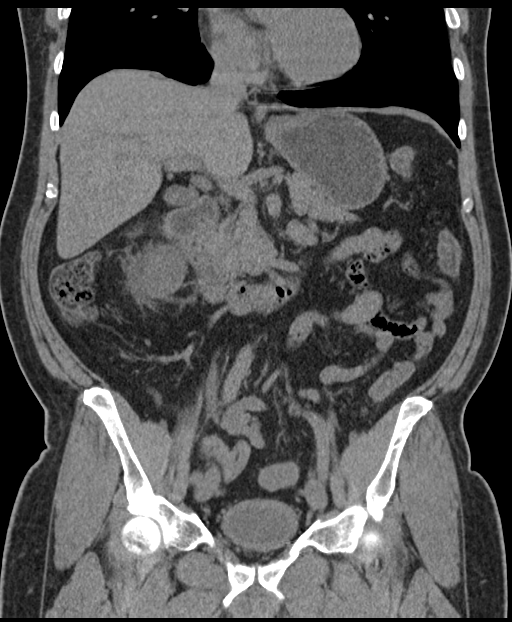
[im 78/141  soft-tissue]
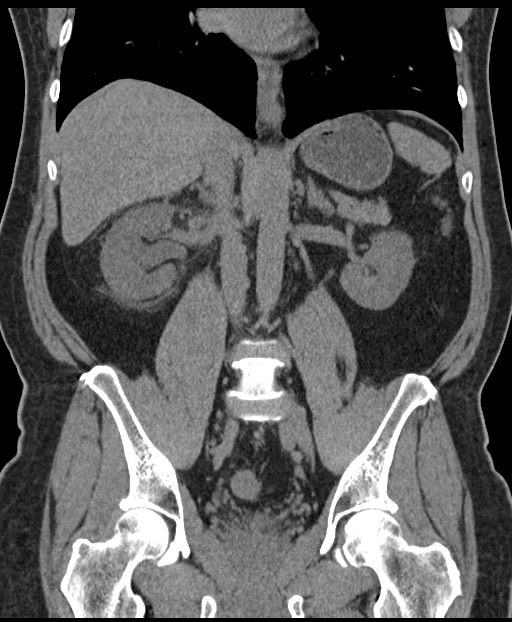

[16 of 46 positions shown; findings below may reference images not displayed]

FINDINGS: Lower chest: A nodule in the left lung base on series 3, image 48
measures 5 mm, not significantly changed. A nodule in the right lung
on series 3, image 10 was not included on the previous study and
measures 3.4 mm. Lung bases are otherwise normal.

Hepatobiliary: No focal liver abnormality is seen. No gallstones,
gallbladder wall thickening, or biliary dilatation.

Pancreas: Unremarkable. No pancreatic ductal dilatation or
surrounding inflammatory changes.

Spleen: Normal in size without focal abnormality.

Adrenals/Urinary Tract: Adrenal glands are normal. No renal stones
are noted. Moderate right hydronephrosis with severe right
perinephric stranding is identified. There is right ureterectasis is
well with a 4 mm stone in the distal right ureter just above the UVJ
on series 2, image 77. No other renal abnormalities are identified.

Stomach/Bowel: The stomach and small bowel are normal. The colon and
appendix are normal.

Vascular/Lymphatic: No significant vascular findings are present. No
enlarged abdominal or pelvic lymph nodes.

Reproductive: Prostate is unremarkable.

Other: No abdominal wall hernia or abnormality. No abdominopelvic
ascites.

Musculoskeletal: No acute or significant osseous findings.
IMPRESSION: 1. There is a 4 mm stone in the distal right ureter just above the
UVJ resulting in moderate hydronephrosis and severe right
perinephric stranding.
2. The 5 mm nodule in the left lung base is stable. A 3 mm nodule in
the right lung was not imaged previously. No follow-up needed if
patient is low-risk. Non-contrast chest CT can be considered in 12
months if patient is high-risk. This recommendation follows the
consensus statement: Guidelines for Management of Incidental
Pulmonary Nodules Detected on CT Images: From the [HOSPITAL]
3. No other significant abnormalities.

## 2019-11-24 DEATH — deceased

## 2021-10-13 ENCOUNTER — Ambulatory Visit (INDEPENDENT_AMBULATORY_CARE_PROVIDER_SITE_OTHER): Payer: Medicare Other | Admitting: Surgery

## 2021-10-13 ENCOUNTER — Ambulatory Visit: Payer: Self-pay

## 2021-10-13 ENCOUNTER — Encounter: Payer: Self-pay | Admitting: Surgery

## 2021-10-13 VITALS — BP 129/90 | HR 81 | Ht 70.0 in | Wt 210.0 lb

## 2021-10-13 DIAGNOSIS — Z7689 Persons encountering health services in other specified circumstances: Secondary | ICD-10-CM

## 2021-10-13 DIAGNOSIS — M5441 Lumbago with sciatica, right side: Secondary | ICD-10-CM

## 2021-10-13 MED ORDER — NAPROXEN 500 MG PO TABS: 500.0000 mg | ORAL_TABLET | Freq: Two times a day (BID) | ORAL | 0 refills | Status: AC | PRN

## 2021-10-14 LAB — COMPREHENSIVE METABOLIC PANEL
AG Ratio: 1.4 (calc) (ref 1.0–2.5)
ALT: 30 U/L (ref 9–46)
AST: 23 U/L (ref 10–35)
Albumin: 4.2 g/dL (ref 3.6–5.1)
Alkaline phosphatase (APISO): 55 U/L (ref 35–144)
BUN: 21 mg/dL (ref 7–25)
CO2: 20 mmol/L (ref 20–32)
Calcium: 9.3 mg/dL (ref 8.6–10.3)
Chloride: 107 mmol/L (ref 98–110)
Creat: 1.11 mg/dL (ref 0.70–1.35)
Globulin: 3 g/dL (calc) (ref 1.9–3.7)
Glucose, Bld: 82 mg/dL (ref 65–99)
Potassium: 4.6 mmol/L (ref 3.5–5.3)
Sodium: 137 mmol/L (ref 135–146)
Total Bilirubin: 0.3 mg/dL (ref 0.2–1.2)
Total Protein: 7.2 g/dL (ref 6.1–8.1)

## 2021-10-14 LAB — CBC
HCT: 49.6 % (ref 38.5–50.0)
Hemoglobin: 17.3 g/dL — ABNORMAL HIGH (ref 13.2–17.1)
MCH: 31.2 pg (ref 27.0–33.0)
MCHC: 34.9 g/dL (ref 32.0–36.0)
MCV: 89.5 fL (ref 80.0–100.0)
MPV: 11.3 fL (ref 7.5–12.5)
Platelets: 225 10*3/uL (ref 140–400)
RBC: 5.54 10*6/uL (ref 4.20–5.80)
RDW: 13.8 % (ref 11.0–15.0)
WBC: 3.8 10*3/uL (ref 3.8–10.8)

## 2021-10-15 ENCOUNTER — Ambulatory Visit: Payer: Medicare Other | Admitting: Physician Assistant

## 2021-10-18 ENCOUNTER — Ambulatory Visit: Payer: Self-pay | Admitting: Orthopedic Surgery

## 2021-11-09 NOTE — Progress Notes (Signed)
   Office Visit Note   Patient: Lance Wells           Date of Birth: 02-01-1951           MRN: [id number] Visit Date: 10/13/2021              Requested by: Ricke Hey, MD [place],  [place] [id number] PCP: Ricke Hey, MD   Assessment & Plan: Visit Diagnoses:  1. Acute bilateral low back pain with right-sided sciatica   2. Encounter to establish care with new doctor     Plan: Patient states that he does need a primary care provider so I did draw baseline CBC and c-Met for him.  I will also send in prescription for naproxen.  Follow-up in 3 weeks for recheck.  If he does not have any improvement may consider getting MRI.  Follow-Up Instructions: Return in about 4 weeks (around 11/10/2021) for with Brodee Mauritz recheck back pain.   Orders:  Orders Placed This Encounter  Procedures   XR Lumbar Spine 2-3 Views   Comprehensive Metabolic Panel (CMET)   CBC   Meds ordered this encounter  Medications   naproxen (NAPROSYN) 500 MG tablet    Sig: Take 1 tablet (500 mg total) by mouth 2 (two) times daily as needed (must take with food).    Dispense:  60 tablet    Refill:  0      Procedures: No procedures performed   Clinical Data: No additional findings.   Subjective: Chief Complaint  Patient presents with   Lower Back - Pain    HPI 71 year old male comes in with complaints of low back pain.  Pain ongoing for about 3 weeks.  He recently had been doing movement of trees which aggravated the problem.  He is also been on some long distance trips.  Some pain into the right buttock.  No left-sided symptoms.  No lower extremity weakness.  Pain aggravated when he is walking and sitting.  He does not currently have a PCP and most recent one was about 3 years ago. Review of Systems No current complaints of cardiopulmonary GI/GU GU issues  Objective: Vital Signs: BP 129/90   Pulse 81   Ht $R'5\' 10"'fG$  (1.778 m)   Wt 210 lb (95.3 kg)   BMI 30.13 kg/m    Physical Exam HENT:     Head: Atraumatic.     Nose: Nose normal.  Pulmonary:     Effort: No respiratory distress.  Musculoskeletal:     Comments: Gait is normal.  No lumbar paraspinal tenderness/spasm.  No sciatic notch tenderness.  Negative logroll bilateral hips.  Negative straight leg raise.  No focal motor deficits.  Neurological:     Mental Status: He is alert and oriented to person, place, and time.     Ortho Exam  Specialty Comments:  No specialty comments available.  Imaging: No results found.   PMFS History: There are no problems to display for this patient.  Past Medical History:  Diagnosis Date   Kidney stone     History reviewed. No pertinent family history.  History reviewed. No pertinent surgical history. Social History   Occupational History   Not on file  Tobacco Use   Smoking status: Never   Smokeless tobacco: Current  Substance and Sexual Activity   Alcohol use: No   Drug use: No   Sexual activity: Not on file

## 2021-11-10 ENCOUNTER — Ambulatory Visit: Payer: Medicare Other | Admitting: Surgery

## 2023-06-23 DIAGNOSIS — J111 Influenza due to unidentified influenza virus with other respiratory manifestations: Secondary | ICD-10-CM | POA: Diagnosis not present

## 2023-06-24 DIAGNOSIS — U071 COVID-19: Secondary | ICD-10-CM | POA: Diagnosis not present

## 2023-07-03 DIAGNOSIS — R053 Chronic cough: Secondary | ICD-10-CM | POA: Diagnosis not present

## 2023-07-03 DIAGNOSIS — J189 Pneumonia, unspecified organism: Secondary | ICD-10-CM | POA: Diagnosis not present

## 2023-07-03 DIAGNOSIS — U071 COVID-19: Secondary | ICD-10-CM | POA: Diagnosis not present

## 2023-09-26 DIAGNOSIS — M25361 Other instability, right knee: Secondary | ICD-10-CM | POA: Diagnosis not present

## 2023-10-09 DIAGNOSIS — M25561 Pain in right knee: Secondary | ICD-10-CM | POA: Diagnosis not present
